# Patient Record
Sex: Male | Born: 1995 | Race: Black or African American | Hispanic: No | Marital: Single | Smoking: Never smoker
Health system: Southern US, Community
[De-identification: ages and names within clinical notes are randomized; demographics above are authoritative.]

## PROBLEM LIST (undated history)

## (undated) DIAGNOSIS — R519 Headache, unspecified: Secondary | ICD-10-CM

## (undated) DIAGNOSIS — K389 Disease of appendix, unspecified: Secondary | ICD-10-CM

## (undated) HISTORY — PX: MULTIPLE TOOTH EXTRACTIONS: SHX2053

---

## 2017-10-02 ENCOUNTER — Emergency Department (HOSPITAL_COMMUNITY): Payer: 59

## 2017-10-02 ENCOUNTER — Emergency Department (HOSPITAL_COMMUNITY)
Admission: EM | Admit: 2017-10-02 | Discharge: 2017-10-02 | Disposition: A | Discharge status: Home / Self Care | Payer: 59 | Attending: Emergency Medicine | Admitting: Emergency Medicine

## 2017-10-02 ENCOUNTER — Encounter (HOSPITAL_COMMUNITY): Payer: Self-pay | Admitting: Emergency Medicine

## 2017-10-02 DIAGNOSIS — R110 Nausea: Secondary | ICD-10-CM | POA: Diagnosis not present

## 2017-10-02 DIAGNOSIS — R1031 Right lower quadrant pain: Secondary | ICD-10-CM

## 2017-10-02 LAB — COMPREHENSIVE METABOLIC PANEL
ALBUMIN: 4.3 g/dL (ref 3.5–5.0)
ALK PHOS: 92 U/L (ref 38–126)
ALT: 15 U/L — ABNORMAL LOW (ref 17–63)
ANION GAP: 9 (ref 5–15)
AST: 18 U/L (ref 15–41)
BILIRUBIN TOTAL: 0.7 mg/dL (ref 0.3–1.2)
BUN: 13 mg/dL (ref 6–20)
CALCIUM: 9.4 mg/dL (ref 8.9–10.3)
CO2: 27 mmol/L (ref 22–32)
Chloride: 105 mmol/L (ref 101–111)
Creatinine, Ser: 0.98 mg/dL (ref 0.61–1.24)
GLUCOSE: 88 mg/dL (ref 65–99)
POTASSIUM: 4 mmol/L (ref 3.5–5.1)
Sodium: 141 mmol/L (ref 135–145)
TOTAL PROTEIN: 7.3 g/dL (ref 6.5–8.1)

## 2017-10-02 LAB — URINALYSIS, ROUTINE W REFLEX MICROSCOPIC
BILIRUBIN URINE: NEGATIVE
Glucose, UA: NEGATIVE mg/dL
Hgb urine dipstick: NEGATIVE
KETONES UR: NEGATIVE mg/dL
LEUKOCYTES UA: NEGATIVE
NITRITE: NEGATIVE
Protein, ur: NEGATIVE mg/dL
SPECIFIC GRAVITY, URINE: 1.015 (ref 1.005–1.030)
pH: 6 (ref 5.0–8.0)

## 2017-10-02 LAB — CBC
HEMATOCRIT: 43.1 % (ref 39.0–52.0)
Hemoglobin: 14.6 g/dL (ref 13.0–17.0)
MCH: 28.2 pg (ref 26.0–34.0)
MCHC: 33.9 g/dL (ref 30.0–36.0)
MCV: 83.4 fL (ref 78.0–100.0)
Platelets: 221 10*3/uL (ref 150–400)
RBC: 5.17 MIL/uL (ref 4.22–5.81)
RDW: 12.3 % (ref 11.5–15.5)
WBC: 10 10*3/uL (ref 4.0–10.5)

## 2017-10-02 LAB — LIPASE, BLOOD: Lipase: 27 U/L (ref 11–51)

## 2017-10-02 MED ORDER — IOPAMIDOL (ISOVUE-300) INJECTION 61%
INTRAVENOUS | Status: AC
  Administered 2017-10-02: 100 mL via INTRAVENOUS
  Filled 2017-10-02: qty 100

## 2017-10-02 MED ORDER — SODIUM CHLORIDE 0.9 % IV BOLUS (SEPSIS)
1000.0000 mL | Freq: Once | INTRAVENOUS | Status: AC
  Administered 2017-10-02: 1000 mL via INTRAVENOUS

## 2017-10-02 MED ORDER — ONDANSETRON 4 MG PO TBDP: 4.0000 mg | ORAL_TABLET | Freq: Three times a day (TID) | ORAL | 0 refills | Status: DC | PRN

## 2017-10-02 MED ORDER — DICYCLOMINE HCL 20 MG PO TABS: 20.0000 mg | ORAL_TABLET | Freq: Two times a day (BID) | ORAL | 0 refills | Status: DC

## 2017-10-02 NOTE — ED Notes (Signed)
Patient sent back to lobby after blood draw by writer.  

## 2017-10-02 NOTE — ED Provider Notes (Signed)
Emergency Department Provider Note   I have reviewed the triage vital signs and the nursing notes.   HISTORY  Chief Complaint Abdominal Pain   HPI Daniel Pruitt is a 21 y.o. male with no significant PMH presents to the emergency room in for evaluation of worsening abdominal pain. He had generalized abdominal discomfort yesterday and today is experiencing more right lower quadrant abdominal pain. No similar symptoms in the past. No fevers or chills. No nausea, vomiting, diarrhea. No surgical history on the abdomen. He went to urgent care and was referred to the emergency pertinent for further evaluation and concern for possible appendicitis. His last PO intake was a snack in the waiting room at approximately 5 PM.     History reviewed. No pertinent past medical history.  There are no active problems to display for this patient.   History reviewed. No pertinent surgical history.    Allergies Patient has no known allergies.  No family history on file.  Social History Social History  Substance Use Topics  . Smoking status: Never Smoker  . Smokeless tobacco: Never Used  . Alcohol use No    Review of Systems  Constitutional: No fever/chills Eyes: No visual changes. ENT: No sore throat. Cardiovascular: Denies chest pain. Respiratory: Denies shortness of breath. Gastrointestinal: Positive RLQ abdominal pain. Positive nausea, no vomiting.  No diarrhea.  No constipation. Genitourinary: Negative for dysuria. Musculoskeletal: Negative for back pain. Skin: Negative for rash. Neurological: Negative for headaches, focal weakness or numbness.  10-point ROS otherwise negative.  ____________________________________________   PHYSICAL EXAM:  VITAL SIGNS: ED Triage Vitals  Enc Vitals Group     BP 10/02/17 1547 (!) 142/98     Pulse Rate 10/02/17 1547 74     Resp 10/02/17 1547 15     Temp 10/02/17 1547 98.1 F (36.7 C)     Temp Source 10/02/17 1547 Oral     SpO2  10/02/17 1547 99 %     Weight 10/02/17 1548 156 lb (70.8 kg)    Constitutional: Alert and oriented. Well appearing and in no acute distress. Eyes: Conjunctivae are normal.  Head: Atraumatic. Nose: No congestion/rhinnorhea. Mouth/Throat: Mucous membranes are moist.  Oropharynx non-erythematous. Neck: No stridor. Cardiovascular: Normal rate, regular rhythm. Good peripheral circulation. Grossly normal heart sounds.   Respiratory: Normal respiratory effort.  No retractions. Lungs CTAB. Gastrointestinal: Soft with mild RLQ tenderness with no rebound. No voluntary guarding with deep palpation. No distention. Musculoskeletal: No lower extremity tenderness nor edema. No gross deformities of extremities. Neurologic:  Normal speech and language. No gross focal neurologic deficits are appreciated.  Skin:  Skin is warm, dry and intact. No rash noted.  ____________________________________________   LABS (all labs ordered are listed, but only abnormal results are displayed)  Labs Reviewed  COMPREHENSIVE METABOLIC PANEL - Abnormal; Notable for the following:       Result Value   ALT 15 (*)    All other components within normal limits  LIPASE, BLOOD  CBC  URINALYSIS, ROUTINE W REFLEX MICROSCOPIC   ____________________________________________  RADIOLOGY  Ct Abdomen Pelvis W Contrast  Result Date: 10/02/2017 CLINICAL DATA:  Right lower quadrant pain since yesterday. Rule out appendicitis. EXAM: CT ABDOMEN AND PELVIS WITH CONTRAST TECHNIQUE: Multidetector CT imaging of the abdomen and pelvis was performed using the standard protocol following bolus administration of intravenous contrast. CONTRAST:  100 mL of Isovue-300 COMPARISON:  None. FINDINGS: Lower chest: No acute abnormality. Hepatobiliary: The gallbladder is decompressed limiting evaluation. The portal vein is  normal. Focal fatty deposition is seen at the falciform ligament. The liver is otherwise normal. Pancreas: Unremarkable. No  pancreatic ductal dilatation or surrounding inflammatory changes. Spleen: Normal in size without focal abnormality. Adrenals/Urinary Tract: The adrenal glands are normal as are the kidneys and ureters. The bladder is distended which is nonspecific. Stomach/Bowel: The stomach and small bowel are normal. The colon is normal. There is an appendicolith in the appendix measuring 9 mm in length and 2.5 mm in width. There is no wall thickening or dilatation associated with the appendix. No periappendiceal stranding is noted. Vascular/Lymphatic: No significant vascular findings are present. No enlarged abdominal or pelvic lymph nodes. Reproductive: Prostate is unremarkable. Other: No abdominal wall hernia or abnormality. No abdominopelvic ascites. Musculoskeletal: No acute or significant osseous findings. IMPRESSION: 1. There is an appendicolith but no evidence of acute appendicitis on today's study. 2. No other acute abnormalities to explain the patient's symptoms. Electronically Signed   By: Gerome Sam III M.D   On: 10/02/2017 19:04    ____________________________________________   PROCEDURES  Procedure(s) performed:   Procedures  None ____________________________________________   INITIAL IMPRESSION / ASSESSMENT AND PLAN / ED COURSE  Pertinent labs & imaging results that were available during my care of the patient were reviewed by me and considered in my medical decision making (see chart for details).  Patient resents to the emergency department for evaluation of right lower quadrant abdominal pain. He has mild tenderness to palpation of the right lower quadrant with some voluntary guarding to deep palpation. No rebound tenderness. No leukocytosis on triage labs. Last PO was 5 PM today. Given exam plan for CT to evaluation for acute appendicitis.   07:10 PM No evidence of acute appendicitis on CT. Patient is well appearing, sitting on the edge of the bed. Plan for symptom management and PCP  follow-up. Discussed strict return precautions.   At this time, I do not feel there is any life-threatening condition present. I have reviewed and discussed all results (EKG, imaging, lab, urine as appropriate), exam findings with patient. I have reviewed nursing notes and appropriate previous records.  I feel the patient is safe to be discharged home without further emergent workup. Discussed usual and customary return precautions. Patient and family (if present) verbalize understanding and are comfortable with this plan.  Patient will follow-up with their primary care provider. If they do not have a primary care provider, information for follow-up has been provided to them. All questions have been answered.  ____________________________________________  FINAL CLINICAL IMPRESSION(S) / ED DIAGNOSES  Final diagnoses:  Right lower quadrant abdominal pain  Nausea     MEDICATIONS GIVEN DURING THIS VISIT:  Medications  sodium chloride 0.9 % bolus 1,000 mL (1,000 mLs Intravenous New Bag/Given 10/02/17 1748)  iopamidol (ISOVUE-300) 61 % injection (100 mLs Intravenous Contrast Given 10/02/17 1817)     NEW OUTPATIENT MEDICATIONS STARTED DURING THIS VISIT:  New Prescriptions   DICYCLOMINE (BENTYL) 20 MG TABLET    Take 1 tablet (20 mg total) by mouth 2 (two) times daily.   ONDANSETRON (ZOFRAN ODT) 4 MG DISINTEGRATING TABLET    Take 1 tablet (4 mg total) by mouth every 8 (eight) hours as needed for nausea or vomiting.    Note:  This document was prepared using Dragon voice recognition software and may include unintentional dictation errors.  Alona Bene, MD Emergency Medicine    Shacoya Burkhammer, Arlyss Repress, MD 10/02/17 [phone]

## 2017-10-02 NOTE — ED Triage Notes (Signed)
Patient reports having LRQ abdominal pain since yesterday. Went to UC earlier today and was sent here to r/o appendicitis.

## 2017-10-02 NOTE — Discharge Instructions (Signed)

## 2018-10-18 ENCOUNTER — Ambulatory Visit (HOSPITAL_COMMUNITY)
Admission: EM | Admit: 2018-10-18 | Discharge: 2018-10-18 | Disposition: A | Discharge status: Home / Self Care | Payer: 59 | Source: Home / Self Care

## 2018-10-18 ENCOUNTER — Encounter (HOSPITAL_COMMUNITY): Payer: Self-pay

## 2018-10-18 ENCOUNTER — Emergency Department (HOSPITAL_COMMUNITY)
Admission: EM | Admit: 2018-10-18 | Discharge: 2018-10-19 | Disposition: A | Discharge status: Home / Self Care | Payer: 59 | Attending: Emergency Medicine | Admitting: Emergency Medicine

## 2018-10-18 ENCOUNTER — Other Ambulatory Visit: Payer: Self-pay

## 2018-10-18 DIAGNOSIS — R1031 Right lower quadrant pain: Secondary | ICD-10-CM

## 2018-10-18 DIAGNOSIS — K381 Appendicular concretions: Secondary | ICD-10-CM | POA: Insufficient documentation

## 2018-10-18 DIAGNOSIS — K389 Disease of appendix, unspecified: Secondary | ICD-10-CM | POA: Insufficient documentation

## 2018-10-18 HISTORY — DX: Disease of appendix, unspecified: K38.9

## 2018-10-18 LAB — CBC
HCT: 49.5 % (ref 39.0–52.0)
HEMOGLOBIN: 16.2 g/dL (ref 13.0–17.0)
MCH: 27.8 pg (ref 26.0–34.0)
MCHC: 32.7 g/dL (ref 30.0–36.0)
MCV: 85.1 fL (ref 80.0–100.0)
Platelets: 270 10*3/uL (ref 150–400)
RBC: 5.82 MIL/uL — AB (ref 4.22–5.81)
RDW: 11.9 % (ref 11.5–15.5)
WBC: 10.1 10*3/uL (ref 4.0–10.5)
nRBC: 0 % (ref 0.0–0.2)

## 2018-10-18 LAB — COMPREHENSIVE METABOLIC PANEL
ALBUMIN: 4.7 g/dL (ref 3.5–5.0)
ALK PHOS: 60 U/L (ref 38–126)
ALT: 17 U/L (ref 0–44)
ANION GAP: 10 (ref 5–15)
AST: 18 U/L (ref 15–41)
BUN: 14 mg/dL (ref 6–20)
CO2: 29 mmol/L (ref 22–32)
Calcium: 9.6 mg/dL (ref 8.9–10.3)
Chloride: 103 mmol/L (ref 98–111)
Creatinine, Ser: 1.01 mg/dL (ref 0.61–1.24)
GFR calc Af Amer: >60 mL/min (ref >60)
GFR calc non Af Amer: >60 mL/min (ref >60)
GLUCOSE: 91 mg/dL (ref 70–99)
POTASSIUM: 4 mmol/L (ref 3.5–5.1)
Sodium: 142 mmol/L (ref 135–145)
Total Bilirubin: 1.4 mg/dL — ABNORMAL HIGH (ref 0.3–1.2)
Total Protein: 7.1 g/dL (ref 6.5–8.1)

## 2018-10-18 LAB — LIPASE, BLOOD: Lipase: 24 U/L (ref 11–51)

## 2018-10-18 NOTE — ED Triage Notes (Signed)
He c/o rlq abd. Pain. He states he had a similar episode a year ago at which time he was dx with an appendicolith; and which improved with antibiotic therapy. He is in no distress.

## 2018-10-19 ENCOUNTER — Encounter (HOSPITAL_COMMUNITY): Payer: Self-pay

## 2018-10-19 ENCOUNTER — Emergency Department (HOSPITAL_COMMUNITY): Payer: 59

## 2018-10-19 LAB — URINALYSIS, ROUTINE W REFLEX MICROSCOPIC
BILIRUBIN URINE: NEGATIVE
Glucose, UA: NEGATIVE mg/dL
HGB URINE DIPSTICK: NEGATIVE
KETONES UR: 5 mg/dL — AB
Leukocytes, UA: NEGATIVE
NITRITE: NEGATIVE
Protein, ur: NEGATIVE mg/dL
Specific Gravity, Urine: 1.013 (ref 1.005–1.030)
pH: 6 (ref 5.0–8.0)

## 2018-10-19 MED ORDER — IOPAMIDOL (ISOVUE-300) INJECTION 61%
100.0000 mL | Freq: Once | INTRAVENOUS | Status: AC | PRN
  Administered 2018-10-19: 100 mL via INTRAVENOUS

## 2018-10-19 MED ORDER — ONDANSETRON 4 MG PO TBDP: 4.0000 mg | ORAL_TABLET | Freq: Three times a day (TID) | ORAL | 0 refills | Status: DC | PRN

## 2018-10-19 MED ORDER — IOPAMIDOL (ISOVUE-300) INJECTION 61%
INTRAVENOUS | Status: AC
  Filled 2018-10-19: qty 100

## 2018-10-19 MED ORDER — SODIUM CHLORIDE 0.9 % IJ SOLN
INTRAMUSCULAR | Status: AC
  Administered 2018-10-19: 01:00:00
  Filled 2018-10-19: qty 50

## 2018-10-19 MED ORDER — HYDROCODONE-ACETAMINOPHEN 5-325 MG PO TABS: 1.0000 | ORAL_TABLET | ORAL | 0 refills | Status: DC | PRN

## 2018-10-19 NOTE — ED Provider Notes (Signed)
[facility]-EMERGENCY DEPT Provider Note   CSN: [id number] Arrival date & time: 10/18/18  2003     History   Chief Complaint Chief Complaint  Patient presents with  . Abdominal Pain    HPI Daniel Pruitt is a 22 y.o. male.  The history is provided by the patient and medical records.  Abdominal Pain   Associated symptoms include nausea.    22 year old male presenting to the ED with right lower quadrant abdominal pain.  States mild 2 days ago but has been worsening since that time.  He reports some nausea but denies vomiting.  Has been eating and drinking well.  No reported fever or chills.  No diarrhea or urinary symptoms.  States he had similar symptoms last year, seen in the ED and diagnosed with appendicolith.  States he was started on antibiotics and seemed to do well with this.  No prior abdominal surgeries.  Past Medical History:  Diagnosis Date  . Appendicolith     Patient Active Problem List   Diagnosis Date Noted  . Appendicolith     No past surgical history on file.      Home Medications    Prior to Admission medications   Medication Sig Start Date End Date Taking? Authorizing Provider  dicyclomine (BENTYL) 20 MG tablet Take 1 tablet (20 mg total) by mouth 2 (two) times daily. Patient not taking: Reported on 10/19/2018 10/02/17   Long, Arlyss Repress, MD  ondansetron (ZOFRAN ODT) 4 MG disintegrating tablet Take 1 tablet (4 mg total) by mouth every 8 (eight) hours as needed for nausea or vomiting. Patient not taking: Reported on 10/19/2018 10/02/17   Long, Arlyss Repress, MD    Family History No family history on file.  Social History Social History   Tobacco Use  . Smoking status: Never Smoker  . Smokeless tobacco: Never Used  Substance Use Topics  . Alcohol use: No  . Drug use: Not on file     Allergies   Patient has no known allergies.   Review of Systems Review of Systems  Gastrointestinal: Positive for abdominal pain and  nausea.  All other systems reviewed and are negative.    Physical Exam Updated Vital Signs BP (!) 137/99 (BP Location: Right Arm)   Pulse (!) 51   Temp 98.2 F (36.8 C) (Oral)   Resp 16   Ht 5\' 8"  (1.727 m)   Wt 73 kg   SpO2 99%   BMI 24.48 kg/m   Physical Exam  Constitutional: He is oriented to person, place, and time. He appears well-developed and well-nourished.  HENT:  Head: Normocephalic and atraumatic.  Mouth/Throat: Oropharynx is clear and moist.  Eyes: Pupils are equal, round, and reactive to light. Conjunctivae and EOM are normal.  Neck: Normal range of motion.  Cardiovascular: Normal rate, regular rhythm and normal heart sounds.  Pulmonary/Chest: Effort normal and breath sounds normal.  Abdominal: Soft. Bowel sounds are normal. There is tenderness in the right lower quadrant. There is no rigidity and no guarding.  Musculoskeletal: Normal range of motion.  Neurological: He is alert and oriented to person, place, and time.  Skin: Skin is warm and dry.  Psychiatric: He has a normal mood and affect.  Nursing note and vitals reviewed.    ED Treatments / Results  Labs (all labs ordered are listed, but only abnormal results are displayed) Labs Reviewed  COMPREHENSIVE METABOLIC PANEL - Abnormal; Notable for the following components:      Result Value  Total Bilirubin 1.4 (*)    All other components within normal limits  CBC - Abnormal; Notable for the following components:   RBC 5.82 (*)    All other components within normal limits  URINALYSIS, ROUTINE W REFLEX MICROSCOPIC - Abnormal; Notable for the following components:   Ketones, ur 5 (*)    All other components within normal limits  LIPASE, BLOOD    EKG None  Radiology Ct Abdomen Pelvis W Contrast  Result Date: 10/19/2018 CLINICAL DATA:  Right lower quadrant pain. White cell count 10.1. History of appendicoliths EXAM: CT ABDOMEN AND PELVIS WITH CONTRAST TECHNIQUE: Multidetector CT imaging of the  abdomen and pelvis was performed using the standard protocol following bolus administration of intravenous contrast. CONTRAST:  100 mL Isovue-300 COMPARISON:  10/02/2017 FINDINGS: Lower chest: Lung bases are clear. Hepatobiliary: No focal liver abnormality is seen. No gallstones, gallbladder wall thickening, or biliary dilatation. Pancreas: Unremarkable. No pancreatic ductal dilatation or surrounding inflammatory changes. Spleen: Normal in size without focal abnormality. Adrenals/Urinary Tract: Adrenal glands are unremarkable. Kidneys are normal, without renal calculi, focal lesion, or hydronephrosis. Bladder is unremarkable. Stomach/Bowel: Stomach, small bowel, and colon are not abnormally distended. No wall thickening or inflammatory changes. Appendix again contains an appendicoliths at the base but is otherwise normal in appearance. No distention or inflammatory changes appreciated. Prominent right lower quadrant lymph nodes are likely reactive. Vascular/Lymphatic: No significant vascular findings are present. No enlarged abdominal or pelvic lymph nodes. Reproductive: Prostate is unremarkable. Other: No abdominal wall hernia or abnormality. No abdominopelvic ascites. Musculoskeletal: No acute or significant osseous findings. IMPRESSION: Appendicular within the base of the appendix with otherwise normal appearing appendix. No inflammatory changes to suggest appendicitis. Prominent right lower quadrant lymph nodes are nonspecific but likely to be reactive. Electronically Signed   By: Burman Nieves M.D.   On: 10/19/2018 01:09    Procedures Procedures (including critical care time)  Medications Ordered in ED Medications - No data to display   Initial Impression / Assessment and Plan / ED Course  I have reviewed the triage vital signs and the nursing notes.  Pertinent labs & imaging results that were available during my care of the patient were reviewed by me and considered in my medical decision  making (see chart for details).  22 y.o. M here with RLQ abdominal pain.  Reports he was seen last year for same, diagnosed with appendicolith.  Started having pain again 2 days ago, steadily worsening.  He is afebrile and nontoxic in appearance.  Does have some tenderness to the right lower quadrant but overall is mild.  Remainder of exam is benign.  Labs reassuring.  Given his history, CT was obtained without findings of acute appendicitis.  Given patient having recurrent issues, will refer to general surgery clinic for ongoing management.  Rx vicodin, zofran for symptomatic control.  He will return here for any new/acute changes.  Final Clinical Impressions(s) / ED Diagnoses   Final diagnoses:  Appendicolith    ED Discharge Orders         Ordered    HYDROcodone-acetaminophen (NORCO/VICODIN) 5-325 MG tablet  Every 4 hours PRN     10/19/18 0156    ondansetron (ZOFRAN ODT) 4 MG disintegrating tablet  Every 8 hours PRN     10/19/18 0156           Garlon Hatchet, PA-C 10/19/18 0216    Dione Booze, MD 10/19/18 [phone]

## 2018-10-19 NOTE — Discharge Instructions (Signed)
Take the prescribed medication as directed. Follow-up with the surgery clinic if ongoing pain or other issues-- call for appt. Return to the ED for new or worsening symptoms.

## 2019-10-19 ENCOUNTER — Other Ambulatory Visit: Payer: Self-pay

## 2019-10-19 DIAGNOSIS — Z20822 Contact with and (suspected) exposure to covid-19: Secondary | ICD-10-CM

## 2019-10-20 LAB — NOVEL CORONAVIRUS, NAA: SARS-CoV-2, NAA: NOT DETECTED

## 2021-04-30 ENCOUNTER — Ambulatory Visit (HOSPITAL_BASED_OUTPATIENT_CLINIC_OR_DEPARTMENT_OTHER)
Admission: RE | Admit: 2021-04-30 | Discharge: 2021-04-30 | Disposition: A | Discharge status: Home / Self Care | Payer: 59 | Source: Ambulatory Visit | Attending: Family Medicine | Admitting: Family Medicine

## 2021-04-30 ENCOUNTER — Other Ambulatory Visit (HOSPITAL_BASED_OUTPATIENT_CLINIC_OR_DEPARTMENT_OTHER): Payer: Self-pay | Admitting: Family Medicine

## 2021-04-30 ENCOUNTER — Other Ambulatory Visit: Payer: Self-pay

## 2021-04-30 DIAGNOSIS — R1031 Right lower quadrant pain: Secondary | ICD-10-CM | POA: Insufficient documentation

## 2021-06-11 ENCOUNTER — Ambulatory Visit: Payer: Self-pay | Admitting: Surgery

## 2021-06-11 NOTE — H&P (Signed)
History of Present Illness (Daniel Mumaw L. Freida Busman MD; 06/11/2021 4:49 PM) Patient words: HPI: [name] is a 25 yo male who was referred for evaluation of appendicoliths. He has had intermittent RLQ pain over the last 4 years and has been seen in the ED a few times. He has had multiple CT scans dating as far back as October 2018 showing appendicoliths but no evidence of acute appendicitis. He says his initial episode of pain in 2018 was the most severe, but he has had intermittent pain ever since then. It now occurs about once per week, and is associated with nausea but no vomiting. He has occasional diarrhea. He recently had another CT scan on 04/30/21 after having another episode of RLQ pain, and this again showed an appendicolith without signs of acute appendicitis. He was referred to me to discuss appendectomy.  PMH: none  PSH: wisdom tooth extraction  FHx: Denies known family history of colorectal cancer or inflammatory bowel disease.  The patient is a 25 year old male.   Past Surgical History Daniel Pruitt, [place]; 06/11/2021 2:12 PM) Oral Surgery    Diagnostic Studies History Daniel Pruitt, [place]; 06/11/2021 2:12 PM) Colonoscopy   never  Allergies Daniel Pruitt, CMA; 06/11/2021 2:13 PM) No Known Drug Allergies   [06/11/2021]: Allergies Reconciled    Medication History Daniel Pruitt, [place]; 06/11/2021 2:13 PM) No Current Medications  Medications Reconciled   Social History Daniel Pruitt, [place]; 06/11/2021 2:12 PM) Alcohol use   Occasional alcohol use. Caffeine use   Tea. No drug use   Tobacco use   Never smoker.  Family History Daniel Pruitt, [place]; 06/11/2021 2:12 PM) Diabetes Mellitus   Father. Kidney Disease   Sister.  Other Problems Daniel Pruitt, CMA; 06/11/2021 2:12 PM) Other disease, cancer, significant illness      Review of Systems Daniel Pruitt CMA; 06/11/2021 2:12 PM) General Present- Chills and Night Sweats. Not Present- Appetite Loss, Fatigue, Fever,  Weight Gain and Weight Loss. Skin Not Present- Change in Wart/Mole, Dryness, Hives, Jaundice, New Lesions, Non-Healing Wounds, Rash and Ulcer. HEENT Present- Wears glasses/contact lenses. Not Present- Earache, Hearing Loss, Hoarseness, Nose Bleed, Oral Ulcers, Ringing in the Ears, Seasonal Allergies, Sinus Pain, Sore Throat, Visual Disturbances and Yellow Eyes. Respiratory Not Present- Bloody sputum, Chronic Cough, Difficulty Breathing, Snoring and Wheezing. Breast Not Present- Breast Mass, Breast Pain, Nipple Discharge and Skin Changes. Cardiovascular Not Present- Chest Pain, Difficulty Breathing Lying Down, Leg Cramps, Palpitations, Rapid Heart Rate, Shortness of Breath and Swelling of Extremities. Gastrointestinal Not Present- Abdominal Pain, Bloating, Bloody Stool, Change in Bowel Habits, Chronic diarrhea, Constipation, Difficulty Swallowing, Excessive gas, Gets full quickly at meals, Hemorrhoids, Indigestion, Nausea, Rectal Pain and Vomiting. Male Genitourinary Not Present- Blood in Urine, Change in Urinary Stream, Frequency, Impotence, Nocturia, Painful Urination, Urgency and Urine Leakage. Musculoskeletal Not Present- Back Pain, Joint Pain, Joint Stiffness, Muscle Pain, Muscle Weakness and Swelling of Extremities. Neurological Not Present- Decreased Memory, Fainting, Headaches, Numbness, Seizures, Tingling, Tremor, Trouble walking and Weakness. Psychiatric Not Present- Anxiety, Bipolar, Change in Sleep Pattern, Depression, Fearful and Frequent crying. Endocrine Not Present- Cold Intolerance, Excessive Hunger, Hair Changes, Heat Intolerance, Hot flashes and New Diabetes. Hematology Not Present- Blood Thinners, Easy Bruising, Excessive bleeding, Gland problems, HIV and Persistent Infections.  Vitals Daniel Pruitt CMA; 06/11/2021 2:13 PM) 06/11/2021 2:12 PM Weight: 170.6 lb   Height: 69 in  Body Surface Area: 1.93 m   Body Mass Index: 25.19 kg/m   Temp.: 98.3 F    Pulse: 110 (Regular)  BP: 122/80(Sitting, Left Arm, Standard)       Physical Exam (Kinsleigh Ludolph L. Freida Busman MD; 06/11/2021 4:49 PM) The physical exam findings are as follows: Note:  Constitutional: No acute distress; conversant; no deformities Neuro: alert and oriented; cranial nerves grossly in tact; no focal deficits Eyes: Moist conjunctiva; anicteric sclerae; extraocular movements in tact Neck: Trachea midline Lungs: Normal respiratory effort; lungs clear to auscultation bilaterally; symmetric chest wall expansion CV: Regular rate and rhythm; no murmurs; no pitting edema GI: Abdomen soft, nontender, nondistended; no masses or organomegaly; no surgical scars MSK: Normal gait and station; no clubbing/cyanosis Psychiatric: Appropriate affect; alert and oriented 3    Assessment & Plan (Tondra Reierson L. Freida Busman MD; 06/11/2021 4:53 PM) APPENDICOLITH (K38.9) Story: 25 yo male referred with frequent RLQ abdominal pain and appendicolith. I reviewed his CT scans. He has an appendicolith near the mid-portion of the appendix, but no periappendiceal stranding or signs of acute appendicitis. He also has some mildly prominent RLQ lymph nodes. Since he has RLQ pain in the setting of an appendicolith, I think it is reasonable to perform an appendectomy. He is at risk of developing acute appendicitis. It is also possible the appendicolith is incidental and the pain is due to another etiology such as mesenteric adenitis, although I think this is less likely. I discussed this with the patient. I recommended laparoscopic appendectomy. The details of the procedure were discussed and he agrees to proceed. This is to be scheduled electively and he will be contacted to schedule a surgery date.  Sophronia Simas, MD [facility] Surgery General, Hepatobiliary and Pancreatic Surgery 06/11/21 4:54 PM

## 2021-06-11 NOTE — H&P (View-Only) (Signed)
History of Present Illness (Dheeraj Hail L. Freida Busman MD; 06/11/2021 4:49 PM) Patient words: HPI: Daniel Pruitt is a 25 yo male who was referred for evaluation of appendicoliths. He has had intermittent RLQ pain over the last 4 years and has been seen in the ED a few times. He has had multiple CT scans dating as far back as October 2018 showing appendicoliths but no evidence of acute appendicitis. He says his initial episode of pain in 2018 was the most severe, but he has had intermittent pain ever since then. It now occurs about once per week, and is associated with nausea but no vomiting. He has occasional diarrhea. He recently had another CT scan on 04/30/21 after having another episode of RLQ pain, and this again showed an appendicolith without signs of acute appendicitis. He was referred to me to discuss appendectomy.  PMH: none  PSH: wisdom tooth extraction  FHx: Denies known family history of colorectal cancer or inflammatory bowel disease.  The patient is a 25 year old male.   Past Surgical History Daniel Pruitt, Daniel Pruitt; 06/11/2021 2:12 PM) Oral Surgery    Diagnostic Studies History Daniel Pruitt, Daniel Pruitt; 06/11/2021 2:12 PM) Colonoscopy   never  Allergies Daniel Pruitt, CMA; 06/11/2021 2:13 PM) No Known Drug Allergies   [06/11/2021]: Allergies Reconciled    Medication History Daniel Pruitt, Daniel Pruitt; 06/11/2021 2:13 PM) No Current Medications  Medications Reconciled   Social History Daniel Pruitt, Daniel Pruitt; 06/11/2021 2:12 PM) Alcohol use   Occasional alcohol use. Caffeine use   Tea. No drug use   Tobacco use   Never smoker.  Family History Daniel Pruitt, Daniel Pruitt; 06/11/2021 2:12 PM) Diabetes Mellitus   Father. Kidney Disease   Sister.  Other Problems Daniel Pruitt, CMA; 06/11/2021 2:12 PM) Other disease, cancer, significant illness      Review of Systems Daniel Pruitt CMA; 06/11/2021 2:12 PM) General Present- Chills and Night Sweats. Not Present- Appetite Loss, Fatigue, Fever,  Weight Gain and Weight Loss. Skin Not Present- Change in Wart/Mole, Dryness, Hives, Jaundice, Daniel Lesions, Non-Healing Wounds, Rash and Ulcer. HEENT Present- Wears glasses/contact lenses. Not Present- Earache, Hearing Loss, Hoarseness, Nose Bleed, Oral Ulcers, Ringing in the Ears, Seasonal Allergies, Sinus Pain, Sore Throat, Visual Disturbances and Yellow Eyes. Respiratory Not Present- Bloody sputum, Chronic Cough, Difficulty Breathing, Snoring and Wheezing. Breast Not Present- Breast Mass, Breast Pain, Nipple Discharge and Skin Changes. Cardiovascular Not Present- Chest Pain, Difficulty Breathing Lying Down, Leg Cramps, Palpitations, Rapid Heart Rate, Shortness of Breath and Swelling of Extremities. Gastrointestinal Not Present- Abdominal Pain, Bloating, Bloody Stool, Change in Bowel Habits, Chronic diarrhea, Constipation, Difficulty Swallowing, Excessive gas, Gets full quickly at meals, Hemorrhoids, Indigestion, Nausea, Rectal Pain and Vomiting. Male Genitourinary Not Present- Blood in Urine, Change in Urinary Stream, Frequency, Impotence, Nocturia, Painful Urination, Urgency and Urine Leakage. Musculoskeletal Not Present- Back Pain, Joint Pain, Joint Stiffness, Muscle Pain, Muscle Weakness and Swelling of Extremities. Neurological Not Present- Decreased Memory, Fainting, Headaches, Numbness, Seizures, Tingling, Tremor, Trouble walking and Weakness. Psychiatric Not Present- Anxiety, Bipolar, Change in Sleep Pattern, Depression, Fearful and Frequent crying. Endocrine Not Present- Cold Intolerance, Excessive Hunger, Hair Changes, Heat Intolerance, Hot flashes and Daniel Diabetes. Hematology Not Present- Blood Thinners, Easy Bruising, Excessive bleeding, Gland problems, HIV and Persistent Infections.  Vitals Daniel Pruitt CMA; 06/11/2021 2:13 PM) 06/11/2021 2:12 PM Weight: 170.6 lb   Height: 69 in  Body Surface Area: 1.93 m   Body Mass Index: 25.19 kg/m   Temp.: 98.3 F    Pulse: 110 (Regular)  BP: 122/80(Sitting, Left Arm, Standard)       Physical Exam (Daniel Pruitt L. Daniel Sonneborn MD; 06/11/2021 4:49 PM) The physical exam findings are as follows: Note:  Constitutional: No acute distress; conversant; no deformities Neuro: alert and oriented; cranial nerves grossly in tact; no focal deficits Eyes: Moist conjunctiva; anicteric sclerae; extraocular movements in tact Neck: Trachea midline Lungs: Normal respiratory effort; lungs clear to auscultation bilaterally; symmetric chest wall expansion CV: Regular rate and rhythm; no murmurs; no pitting edema GI: Abdomen soft, nontender, nondistended; no masses or organomegaly; no surgical scars MSK: Normal gait and station; no clubbing/cyanosis Psychiatric: Appropriate affect; alert and oriented 3    Assessment & Plan (Daniel Pruitt L. Daniel Springborn MD; 06/11/2021 4:53 PM) APPENDICOLITH (K38.9) Story: 25 yo male referred with frequent RLQ abdominal pain and appendicolith. I reviewed his CT scans. He has an appendicolith near the mid-portion of the appendix, but no periappendiceal stranding or signs of acute appendicitis. He also has some mildly prominent RLQ lymph nodes. Since he has RLQ pain in the setting of an appendicolith, I think it is reasonable to perform an appendectomy. He is at risk of developing acute appendicitis. It is also possible the appendicolith is incidental and the pain is due to another etiology such as mesenteric adenitis, although I think this is less likely. I discussed this with the patient. I recommended laparoscopic appendectomy. The details of the procedure were discussed and he agrees to proceed. This is to be scheduled electively and he will be contacted to schedule a surgery date.  Karalynn Cottone, MD [facility], Hepatobiliary and Pancreatic Surgery 06/11/21 4:54 PM   

## 2021-07-01 NOTE — Pre-Procedure Instructions (Addendum)
Surgical Instructions    Your procedure is scheduled on Wednesday, July 27th.  Report to [facility] Main Entrance "A" at 8:00 A.M., then check in with the Admitting office.  Call this number if you have problems the morning of surgery:  [phone]   If you have any questions prior to your surgery date call [phone]: Open Monday-Friday 8am-4pm    Remember:  Do not eat or drink after midnight the night before your surgery    There are no medications that you need to take on the morning of surgery.  As of today, STOP taking any Aspirin (unless otherwise instructed by your surgeon) Aleve, Naproxen, Ibuprofen, Motrin, Advil, Goody's, BC's, all herbal medications, fish oil, and all vitamins.                     Do NOT Smoke (Tobacco/Vaping) or drink Alcohol 24 hours prior to your procedure.  If you use a CPAP at night, you may bring all equipment for your overnight stay.   Contacts, glasses, piercing's, hearing aid's, dentures or partials may not be worn into surgery, please bring cases for these belongings.    For patients admitted to the hospital, discharge time will be determined by your treatment team.   Patients discharged the day of surgery will not be allowed to drive home, and someone needs to stay with them for 24 hours.  ONLY 1 SUPPORT PERSON MAY BE PRESENT WHILE YOU ARE IN SURGERY. IF YOU ARE TO BE ADMITTED ONCE YOU ARE IN YOUR ROOM YOU WILL BE ALLOWED TWO (2) VISITORS.  Minor children may have two parents present. Special consideration for safety and communication needs will be reviewed on a case by case basis.   Special instructions:   [place]- Preparing For Surgery  Before surgery, you can play an important role. Because skin is not sterile, your skin needs to be as free of germs as possible. You can reduce the number of germs on your skin by washing with CHG (chlorahexidine gluconate) Soap before surgery.  CHG is an antiseptic cleaner which kills germs and bonds  with the skin to continue killing germs even after washing.    Oral Hygiene is also important to reduce your risk of infection.  Remember - BRUSH YOUR TEETH THE MORNING OF SURGERY WITH YOUR REGULAR TOOTHPASTE  Please do not use if you have an allergy to CHG or antibacterial soaps. If your skin becomes reddened/irritated stop using the CHG.  Do not shave (including legs and underarms) for at least 48 hours prior to first CHG shower. It is OK to shave your face.  Please follow these instructions carefully.   Shower the NIGHT BEFORE SURGERY and the MORNING OF SURGERY  If you chose to wash your hair, wash your hair first as usual with your normal shampoo.  After you shampoo, rinse your hair and body thoroughly to remove the shampoo.  Use CHG Soap as you would any other liquid soap. You can apply CHG directly to the skin and wash gently with a scrungie or a clean washcloth.   Apply the CHG Soap to your body ONLY FROM THE NECK DOWN.  Do not use on open wounds or open sores. Avoid contact with your eyes, ears, mouth and genitals (private parts). Wash Face and genitals (private parts)  with your normal soap.   Wash thoroughly, paying special attention to the area where your surgery will be performed.  Thoroughly rinse your body with warm water from  the neck down.  DO NOT shower/wash with your normal soap after using and rinsing off the CHG Soap.  Pat yourself dry with a CLEAN TOWEL.  Wear CLEAN PAJAMAS to bed the night before surgery  Place CLEAN SHEETS on your bed the night before your surgery  DO NOT SLEEP WITH PETS.   Day of Surgery: Shower with CHG soap. Do not wear jewelry. Do not wear lotions, powders, colognes, or deodorant. Men may shave face and neck. Do not bring valuables to the hospital. [facility] is not responsible for any belongings or valuables. Wear Clean/Comfortable clothing the morning of surgery Remember to brush your teeth WITH YOUR REGULAR TOOTHPASTE.    Please read over the following fact sheets that you were given.

## 2021-07-02 ENCOUNTER — Encounter (HOSPITAL_COMMUNITY)
Admission: RE | Admit: 2021-07-02 | Discharge: 2021-07-02 | Disposition: A | Discharge status: Home / Self Care | Payer: 59 | Source: Ambulatory Visit | Attending: Surgery | Admitting: Surgery

## 2021-07-02 ENCOUNTER — Other Ambulatory Visit: Payer: Self-pay

## 2021-07-02 ENCOUNTER — Encounter (HOSPITAL_COMMUNITY): Payer: Self-pay

## 2021-07-02 DIAGNOSIS — Z01812 Encounter for preprocedural laboratory examination: Secondary | ICD-10-CM | POA: Insufficient documentation

## 2021-07-02 HISTORY — DX: Headache, unspecified: R51.9

## 2021-07-02 LAB — CBC
HCT: 46.9 % (ref 39.0–52.0)
Hemoglobin: 15.2 g/dL (ref 13.0–17.0)
MCH: 27.8 pg (ref 26.0–34.0)
MCHC: 32.4 g/dL (ref 30.0–36.0)
MCV: 85.7 fL (ref 80.0–100.0)
Platelets: 255 10*3/uL (ref 150–400)
RBC: 5.47 MIL/uL (ref 4.22–5.81)
RDW: 11.9 % (ref 11.5–15.5)
WBC: 6.1 10*3/uL (ref 4.0–10.5)
nRBC: 0 % (ref 0.0–0.2)

## 2021-07-02 NOTE — Progress Notes (Signed)
PCP - Garth Bigness, MD w/ [facility] Cardiologist - Denies  PPM/ICD - Denies  Chest x-ray - N/A EKG - N/A Stress Test - Denies ECHO - Denies Cardiac Cath - Denies  Sleep Study - Denies  Pt negative for DM.  Blood Thinner Instructions: N/A Aspirin Instructions: N/A  ERAS Protcol - N/A PRE-SURGERY Ensure or G2- N/A  COVID TEST- N/A; Ambulatory sx   Anesthesia review: No  Patient denies shortness of breath, fever, cough and chest pain at PAT appointment   All instructions explained to the patient, with a verbal understanding of the material. Patient agrees to go over the instructions while at home for a better understanding. The opportunity to ask questions was provided.

## 2021-07-08 NOTE — Anesthesia Preprocedure Evaluation (Addendum)
Anesthesia Evaluation  Patient identified by MRN, date of birth, ID band Patient awake    Reviewed: Allergy & Precautions, NPO status , Patient's Chart, lab work & pertinent test results  Airway Mallampati: II  TM Distance: >3 FB Neck ROM: Full    Dental no notable dental hx. (+) Chipped,    Pulmonary neg pulmonary ROS,    Pulmonary exam normal breath sounds clear to auscultation       Cardiovascular negative cardio ROS Normal cardiovascular exam Rhythm:Regular Rate:Normal     Neuro/Psych  Headaches, negative psych ROS   GI/Hepatic negative GI ROS, Neg liver ROS,   Endo/Other  negative endocrine ROS  Renal/GU negative Renal ROS  negative genitourinary   Musculoskeletal negative musculoskeletal ROS (+)   Abdominal   Peds  Hematology negative hematology ROS (+)   Anesthesia Other Findings appendicolith  Reproductive/Obstetrics negative OB ROS                            Anesthesia Physical Anesthesia Plan  ASA: 1  Anesthesia Plan: General   Post-op Pain Management:    Induction: Intravenous  PONV Risk Score and Plan: 3 and Ondansetron, Dexamethasone, Midazolam and Treatment may vary due to age or medical condition  Airway Management Planned: Oral ETT  Additional Equipment: None  Intra-op Plan:   Post-operative Plan: Extubation in OR  Informed Consent: I have reviewed the patients History and Physical, chart, labs and discussed the procedure including the risks, benefits and alternatives for the proposed anesthesia with the patient or authorized representative who has indicated his/her understanding and acceptance.     Dental advisory given  Plan Discussed with: CRNA  Anesthesia Plan Comments:        Anesthesia Quick Evaluation

## 2021-07-09 ENCOUNTER — Ambulatory Visit (HOSPITAL_COMMUNITY)
Admission: RE | Admit: 2021-07-09 | Discharge: 2021-07-09 | Disposition: A | Discharge status: Home / Self Care | Payer: 59 | Source: Ambulatory Visit | Attending: Surgery | Admitting: Surgery

## 2021-07-09 ENCOUNTER — Ambulatory Visit (HOSPITAL_COMMUNITY): Payer: 59 | Admitting: Anesthesiology

## 2021-07-09 ENCOUNTER — Encounter (HOSPITAL_COMMUNITY)
Admission: RE | Disposition: A | Discharge status: Home / Self Care | Payer: Self-pay | Source: Ambulatory Visit | Attending: Surgery

## 2021-07-09 ENCOUNTER — Other Ambulatory Visit: Payer: Self-pay

## 2021-07-09 ENCOUNTER — Encounter (HOSPITAL_COMMUNITY): Payer: Self-pay | Admitting: Surgery

## 2021-07-09 DIAGNOSIS — Z841 Family history of disorders of kidney and ureter: Secondary | ICD-10-CM | POA: Diagnosis not present

## 2021-07-09 DIAGNOSIS — Z833 Family history of diabetes mellitus: Secondary | ICD-10-CM | POA: Diagnosis not present

## 2021-07-09 DIAGNOSIS — K381 Appendicular concretions: Secondary | ICD-10-CM | POA: Diagnosis present

## 2021-07-09 HISTORY — PX: LAPAROSCOPIC APPENDECTOMY: SHX408

## 2021-07-09 SURGERY — APPENDECTOMY, LAPAROSCOPIC
Anesthesia: General

## 2021-07-09 MED ORDER — ONDANSETRON HCL 4 MG/2ML IJ SOLN
INTRAMUSCULAR | Status: DC | PRN
  Administered 2021-07-09: 4 mg via INTRAVENOUS

## 2021-07-09 MED ORDER — KETOROLAC TROMETHAMINE 15 MG/ML IJ SOLN
INTRAMUSCULAR | Status: DC | PRN
  Administered 2021-07-09: 30 mg via INTRAVENOUS

## 2021-07-09 MED ORDER — FENTANYL CITRATE (PF) 250 MCG/5ML IJ SOLN
INTRAMUSCULAR | Status: DC | PRN
  Administered 2021-07-09 (×2): 50 ug via INTRAVENOUS

## 2021-07-09 MED ORDER — HYDROCODONE-ACETAMINOPHEN 5-325 MG PO TABS: 1.0000 | ORAL_TABLET | ORAL | 0 refills | Status: AC | PRN

## 2021-07-09 MED ORDER — CEFAZOLIN SODIUM-DEXTROSE 2-4 GM/100ML-% IV SOLN
2.0000 g | INTRAVENOUS | Status: AC
  Administered 2021-07-09: 2 g via INTRAVENOUS

## 2021-07-09 MED ORDER — MEPERIDINE HCL 25 MG/ML IJ SOLN: 6.2500 mg | INTRAMUSCULAR | Status: DC | PRN

## 2021-07-09 MED ORDER — LIDOCAINE 2% (20 MG/ML) 5 ML SYRINGE
INTRAMUSCULAR | Status: DC | PRN
  Administered 2021-07-09: 80 mg via INTRAVENOUS

## 2021-07-09 MED ORDER — BUPIVACAINE-EPINEPHRINE (PF) 0.25% -1:200000 IJ SOLN
INTRAMUSCULAR | Status: AC
  Filled 2021-07-09: qty 30

## 2021-07-09 MED ORDER — METRONIDAZOLE 500 MG/100ML IV SOLN
INTRAVENOUS | Status: AC
  Filled 2021-07-09: qty 100

## 2021-07-09 MED ORDER — KETOROLAC TROMETHAMINE 30 MG/ML IJ SOLN: 30.0000 mg | Freq: Once | INTRAMUSCULAR | Status: DC | PRN

## 2021-07-09 MED ORDER — FENTANYL CITRATE (PF) 250 MCG/5ML IJ SOLN
INTRAMUSCULAR | Status: AC
  Filled 2021-07-09: qty 5

## 2021-07-09 MED ORDER — ACETAMINOPHEN 500 MG PO TABS: 1000.0000 mg | ORAL_TABLET | ORAL | Status: AC

## 2021-07-09 MED ORDER — CHLORHEXIDINE GLUCONATE 0.12 % MT SOLN
15.0000 mL | Freq: Once | OROMUCOSAL | Status: AC
  Administered 2021-07-09: 15 mL via OROMUCOSAL
  Filled 2021-07-09: qty 15

## 2021-07-09 MED ORDER — DEXAMETHASONE SODIUM PHOSPHATE 10 MG/ML IJ SOLN
INTRAMUSCULAR | Status: DC | PRN
  Administered 2021-07-09: 10 mg via INTRAVENOUS

## 2021-07-09 MED ORDER — ROCURONIUM BROMIDE 10 MG/ML (PF) SYRINGE
PREFILLED_SYRINGE | INTRAVENOUS | Status: AC
  Filled 2021-07-09: qty 10

## 2021-07-09 MED ORDER — ACETAMINOPHEN 500 MG PO TABS
ORAL_TABLET | ORAL | Status: AC
  Administered 2021-07-09: 1000 mg via ORAL
  Filled 2021-07-09: qty 2

## 2021-07-09 MED ORDER — OXYCODONE HCL 5 MG/5ML PO SOLN
5.0000 mg | Freq: Once | ORAL | Status: DC | PRN
Start: 2021-07-09 — End: 2021-07-09

## 2021-07-09 MED ORDER — METRONIDAZOLE 500 MG/100ML IV SOLN
500.0000 mg | INTRAVENOUS | Status: AC
  Administered 2021-07-09: 500 mg via INTRAVENOUS

## 2021-07-09 MED ORDER — LIDOCAINE 2% (20 MG/ML) 5 ML SYRINGE
INTRAMUSCULAR | Status: AC
  Filled 2021-07-09: qty 5

## 2021-07-09 MED ORDER — GABAPENTIN 300 MG PO CAPS: 300.0000 mg | ORAL_CAPSULE | ORAL | Status: AC

## 2021-07-09 MED ORDER — PROMETHAZINE HCL 25 MG/ML IJ SOLN: 6.2500 mg | INTRAMUSCULAR | Status: DC | PRN

## 2021-07-09 MED ORDER — OXYCODONE HCL 5 MG PO TABS: 5.0000 mg | ORAL_TABLET | Freq: Once | ORAL | Status: DC | PRN

## 2021-07-09 MED ORDER — ORAL CARE MOUTH RINSE: 15.0000 mL | Freq: Once | OROMUCOSAL | Status: AC

## 2021-07-09 MED ORDER — CEFAZOLIN SODIUM-DEXTROSE 2-4 GM/100ML-% IV SOLN
INTRAVENOUS | Status: AC
  Filled 2021-07-09: qty 100

## 2021-07-09 MED ORDER — 0.9 % SODIUM CHLORIDE (POUR BTL) OPTIME
TOPICAL | Status: DC | PRN
  Administered 2021-07-09: 1000 mL

## 2021-07-09 MED ORDER — MIDAZOLAM HCL 2 MG/2ML IJ SOLN
INTRAMUSCULAR | Status: AC
  Filled 2021-07-09: qty 2

## 2021-07-09 MED ORDER — PROPOFOL 10 MG/ML IV BOLUS
INTRAVENOUS | Status: DC | PRN
  Administered 2021-07-09: 200 mg via INTRAVENOUS

## 2021-07-09 MED ORDER — HYDROMORPHONE HCL 1 MG/ML IJ SOLN: 0.2500 mg | INTRAMUSCULAR | Status: DC | PRN

## 2021-07-09 MED ORDER — GABAPENTIN 300 MG PO CAPS
ORAL_CAPSULE | ORAL | Status: AC
  Administered 2021-07-09: 300 mg via ORAL
  Filled 2021-07-09: qty 1

## 2021-07-09 MED ORDER — BUPIVACAINE-EPINEPHRINE 0.25% -1:200000 IJ SOLN
INTRAMUSCULAR | Status: DC | PRN
  Administered 2021-07-09: 18 mL

## 2021-07-09 MED ORDER — ROCURONIUM BROMIDE 100 MG/10ML IV SOLN
INTRAVENOUS | Status: DC | PRN
  Administered 2021-07-09: 70 mg via INTRAVENOUS

## 2021-07-09 MED ORDER — PROPOFOL 10 MG/ML IV BOLUS
INTRAVENOUS | Status: AC
  Filled 2021-07-09: qty 40

## 2021-07-09 MED ORDER — MIDAZOLAM HCL 2 MG/2ML IJ SOLN
INTRAMUSCULAR | Status: DC | PRN
  Administered 2021-07-09: 2 mg via INTRAVENOUS

## 2021-07-09 MED ORDER — LACTATED RINGERS IV SOLN: INTRAVENOUS | Status: DC

## 2021-07-09 MED ORDER — SUGAMMADEX SODIUM 200 MG/2ML IV SOLN
INTRAVENOUS | Status: DC | PRN
  Administered 2021-07-09: 160 mg via INTRAVENOUS

## 2021-07-09 SURGICAL SUPPLY — 41 items
APPLIER CLIP 5 13 M/L LIGAMAX5 (MISCELLANEOUS) ×2
BAG COUNTER SPONGE SURGICOUNT (BAG) ×2 IMPLANT
BLADE CLIPPER SURG (BLADE) IMPLANT
CANISTER SUCT 3000ML PPV (MISCELLANEOUS) ×2 IMPLANT
CHLORAPREP W/TINT 26 (MISCELLANEOUS) ×2 IMPLANT
CLIP APPLIE 5 13 M/L LIGAMAX5 (MISCELLANEOUS) ×1 IMPLANT
COVER SURGICAL LIGHT HANDLE (MISCELLANEOUS) ×2 IMPLANT
CUTTER FLEX LINEAR 45M (STAPLE) ×2 IMPLANT
DERMABOND ADVANCED (GAUZE/BANDAGES/DRESSINGS) ×1
DERMABOND ADVANCED .7 DNX12 (GAUZE/BANDAGES/DRESSINGS) ×1 IMPLANT
ELECT REM PT RETURN 9FT ADLT (ELECTROSURGICAL) ×2
ELECTRODE REM PT RTRN 9FT ADLT (ELECTROSURGICAL) ×1 IMPLANT
ENDOLOOP SUT PDS II  0 18 (SUTURE)
ENDOLOOP SUT PDS II 0 18 (SUTURE) IMPLANT
GLOVE SURG POLY MICRO LF SZ5.5 (GLOVE) ×2 IMPLANT
GLOVE SURG UNDER POLY LF SZ6 (GLOVE) ×2 IMPLANT
GOWN STRL REUS W/ TWL LRG LVL3 (GOWN DISPOSABLE) ×3 IMPLANT
GOWN STRL REUS W/TWL LRG LVL3 (GOWN DISPOSABLE) ×3
KIT BASIN OR (CUSTOM PROCEDURE TRAY) ×2 IMPLANT
KIT TURNOVER KIT B (KITS) ×2 IMPLANT
NS IRRIG 1000ML POUR BTL (IV SOLUTION) ×2 IMPLANT
PAD ARMBOARD 7.5X6 YLW CONV (MISCELLANEOUS) ×4 IMPLANT
PENCIL BUTTON HOLSTER BLD 10FT (ELECTRODE) ×2 IMPLANT
POUCH SPECIMEN RETRIEVAL 10MM (ENDOMECHANICALS) ×2 IMPLANT
RELOAD STAPLE TA45 3.5 REG BLU (ENDOMECHANICALS) ×2 IMPLANT
SCISSORS LAP 5X35 DISP (ENDOMECHANICALS) ×2 IMPLANT
SET IRRIG TUBING LAPAROSCOPIC (IRRIGATION / IRRIGATOR) ×2 IMPLANT
SET TUBE SMOKE EVAC HIGH FLOW (TUBING) ×2 IMPLANT
SHEARS HARMONIC ACE PLUS 36CM (ENDOMECHANICALS) ×2 IMPLANT
SLEEVE ENDOPATH XCEL 5M (ENDOMECHANICALS) ×2 IMPLANT
SPECIMEN JAR SMALL (MISCELLANEOUS) ×2 IMPLANT
SUT MNCRL AB 4-0 PS2 18 (SUTURE) ×2 IMPLANT
SUT VIC AB 3-0 SH 27 (SUTURE) ×1
SUT VIC AB 3-0 SH 27X BRD (SUTURE) ×1 IMPLANT
TOWEL GREEN STERILE (TOWEL DISPOSABLE) ×2 IMPLANT
TOWEL GREEN STERILE FF (TOWEL DISPOSABLE) ×2 IMPLANT
TRAY FOLEY W/BAG SLVR 14FR (SET/KITS/TRAYS/PACK) IMPLANT
TRAY LAPAROSCOPIC MC (CUSTOM PROCEDURE TRAY) ×2 IMPLANT
TROCAR XCEL BLUNT TIP 100MML (ENDOMECHANICALS) ×2 IMPLANT
TROCAR XCEL NON-BLD 5MMX100MML (ENDOMECHANICALS) ×2 IMPLANT
WATER STERILE IRR 1000ML POUR (IV SOLUTION) ×2 IMPLANT

## 2021-07-09 NOTE — Op Note (Signed)
Date: 07/09/21  Patient: Daniel Pruitt MRN: [id number]  Preoperative Diagnosis: Appendicolith Postoperative Diagnosis: Same  Procedure: Laparoscopic appendectomy  Surgeon: Sophronia Simas, MD  EBL: Minimal  Anesthesia: General  Specimens: Appendix  Indications: [name] is a 25 yo male who presented with intermittent RLQ abdominal pain for the last few years. CT scans showed a persistent appendicolith but no findings of acute appendicitis. There have been no other findings on imaging or other workup to explain his symptoms. After a discussion of the risks and benefits of surgery vs continued observation, the patient agreed to proceed with elective appendectomy.  Findings: Grossly normal appearance of the appendix, no findings of acute appendicitis.  Procedure details: Informed consent was obtained in the preoperative area prior to the procedure. The patient was brought to the operating room and placed on the table in the supine position. General anesthesia was induced and appropriate lines and drains were placed for intraoperative monitoring. Perioperative antibiotics were administered per SCIP guidelines. The abdomen was prepped and draped in the usual sterile fashion. A pre-procedure timeout was taken verifying patient identity, surgical site and procedure to be performed.  A small infraumbilical skin incision was made and the subcutaneous tissue was spread to expose the fascia. There was a small umbilical hernia present. The umbilical stalk was circumferentially dissected out and the overlying skin was taken off the stalk and hernia sac sharply. The hernia defect was extended slightly to accommodate a 27mm Hassan trocar. The trocar was placed through the defect and the abdomen was insufflated. The abdomen was inspected with no evidence of visceral or vascular injury. A suprapubic 27mm port was placed, followed by a 57mm port in the LLQ, both under direct visualization. The appendix was  identified in the RLQ, and was grossly normal in appearance. The retroperitoneal attachments were taken down with Harmonic shears. The terminal ileum was somewhat adherent to the pelvic sidewall near the appendix, and this was taken down using careful sharp dissection. The mesoappendix was divided using the harmonic. The appendix was then divided at the base from the cecum using a 66mm stapler with a blue load. The specimen was placed in an endocatch bag and removed and sent for routine pathology. The staple line was inspected and appeared in tact with no bleeding or leakage. The ports were removed and the pneumoperitoneum was evacuated. The umbilical port site fascia was closed with an 0 Vicryl suture. The umbilical skin was tacked down to the fascia using 3-0 Vicryl. The skin at all port sites was closed with 4-0 monocryl subcuticular suture. Dermabond was applied.  The patient tolerated the procedure well with no apparent complications. All counts were correct x2 at the end of the procedure. The patient was extubated and taken to PACU in stable condition.  Sophronia Simas, MD 07/09/21 10:46 AM

## 2021-07-09 NOTE — Discharge Instructions (Addendum)
[facility] DISCHARGE INSTRUCTIONS  Activity No heavy lifting greater than 15 pounds for 4 weeks after surgery. Ok to shower in 24 hours after surgery, but do not bathe or submerge incisions underwater. Do not drive while taking narcotic pain medication.  Wound Care Your incision is covered with skin glue called Dermabond. This will peel off on its own over time. You may shower and allow warm soapy water to run over your incisions. Gently pat dry. Do not submerge your incision underwater. Monitor your incision for any new redness, tenderness, or drainage.  When to Call us: Fever greater than 100.5 New redness, drainage, or swelling at incision site Severe pain, nausea, or vomiting   Follow-up You will have a postoperative appointment with Dr. Freida Busman 3-4 weeks after surgery. This will be at the [facility] office at [address], [place], [place]. Please arrive at least 15 minutes prior to your scheduled appointment time.  For questions or concerns, please call the office at [phone].

## 2021-07-09 NOTE — Transfer of Care (Signed)
Immediate Anesthesia Transfer of Care Note  Patient: Daniel Pruitt  Procedure(s) Performed: LAPAROSCOPIC APPENDECTOMY  Patient Location: PACU  Anesthesia Type:General  Level of Consciousness: awake and patient cooperative  Airway & Oxygen Therapy: Patient Spontanous Breathing  Post-op Assessment: Report given to RN and Post -op Vital signs reviewed and stable  Post vital signs: Reviewed and stable  Last Vitals:  Vitals Value Taken Time  BP 111/62 07/09/21 1050  Temp 36.4 C 07/09/21 1048  Pulse 50 07/09/21 1053  Resp 20 07/09/21 1055  SpO2 96 % 07/09/21 1053  Vitals shown include unvalidated device data.  Last Pain:  Vitals:   07/09/21 1048  TempSrc:   PainSc: Asleep      Patients Stated Pain Goal: 4 (07/09/21 0820)  Complications: No notable events documented.

## 2021-07-09 NOTE — Anesthesia Postprocedure Evaluation (Signed)
Anesthesia Post Note  Patient: Daniel Pruitt  Procedure(s) Performed: LAPAROSCOPIC APPENDECTOMY     Patient location during evaluation: PACU Anesthesia Type: General Level of consciousness: awake and alert, oriented and patient cooperative Pain management: pain level controlled Vital Signs Assessment: post-procedure vital signs reviewed and stable Respiratory status: spontaneous breathing, nonlabored ventilation and respiratory function stable Cardiovascular status: blood pressure returned to baseline and stable Postop Assessment: no apparent nausea or vomiting Anesthetic complications: no   No notable events documented.  Last Vitals:  Vitals:   07/09/21 1115 07/09/21 1120  BP: 119/66 122/80  Pulse: (!) 53 (!) 53  Resp: 19 13  Temp:    SpO2: 100% 100%    Last Pain:  Vitals:   07/09/21 1048  TempSrc:   PainSc: Asleep                 Lannie Fields

## 2021-07-09 NOTE — Anesthesia Procedure Notes (Signed)
Procedure Name: Intubation Date/Time: 07/09/2021 9:46 AM Performed by: Stanton Kidney, CRNA Pre-anesthesia Checklist: Patient identified, Emergency Drugs available, Suction available and Patient being monitored Patient Re-evaluated:Patient Re-evaluated prior to induction Oxygen Delivery Method: Circle system utilized Preoxygenation: Pre-oxygenation with 100% oxygen Induction Type: IV induction Ventilation: Mask ventilation without difficulty Laryngoscope Size: Miller and 3 Tube type: Oral Tube size: 7.5 mm Number of attempts: 1 Airway Equipment and Method: Stylet and Oral airway Placement Confirmation: ETT inserted through vocal cords under direct vision, positive ETCO2 and breath sounds checked- equal and bilateral Tube secured with: Tape Dental Injury: Teeth and Oropharynx as per pre-operative assessment

## 2021-07-09 NOTE — Interval H&P Note (Signed)
History and Physical Interval Note:  07/09/2021 8:57 AM  Daniel Pruitt  has presented today for surgery, with the diagnosis of APPENDICOLITH.  The various methods of treatment have been discussed with the patient and family. After consideration of risks, benefits and other options for treatment, the patient has consented to  Procedure(s): LAPAROSCOPIC APPENDECTOMY (N/A) as a surgical intervention.  The patient's history has been reviewed, patient examined, no change in status, stable for surgery.  I have reviewed the patient's chart and labs.  Questions were answered to the patient's satisfaction.     Fritzi Mandes

## 2021-07-10 ENCOUNTER — Encounter (HOSPITAL_COMMUNITY): Payer: Self-pay | Admitting: Surgery

## 2021-07-10 LAB — SURGICAL PATHOLOGY

## 2022-04-16 ENCOUNTER — Ambulatory Visit
Admission: RE | Admit: 2022-04-16 | Discharge: 2022-04-16 | Disposition: A | Discharge status: Home / Self Care | Payer: 59 | Source: Ambulatory Visit | Attending: Family Medicine | Admitting: Family Medicine

## 2022-04-16 ENCOUNTER — Other Ambulatory Visit: Payer: Self-pay | Admitting: Family Medicine

## 2022-04-16 DIAGNOSIS — M25532 Pain in left wrist: Secondary | ICD-10-CM

## 2023-04-10 IMAGING — CT CT ABD-PELV W/O CM
2 of 4 series · 16 of 46 positions shown, 18 images · non-contrast
Comparison: CT 10/19/2018

CLINICAL DATA: Right lower quadrant pain intermittently for 3-4
years, increasing over the last week, known appendicolith.

EXAM:
CT ABDOMEN AND PELVIS WITHOUT CONTRAST
TECHNIQUE: Multidetector CT imaging of the abdomen and pelvis was performed
following the standard protocol without IV contrast.

[Series 2: abd pel wo · axial · 0.72mm/px · z∈[+817,+1237]mm · 13 of 92 slices shown, 15 images]
[im 4/92  soft-tissue]
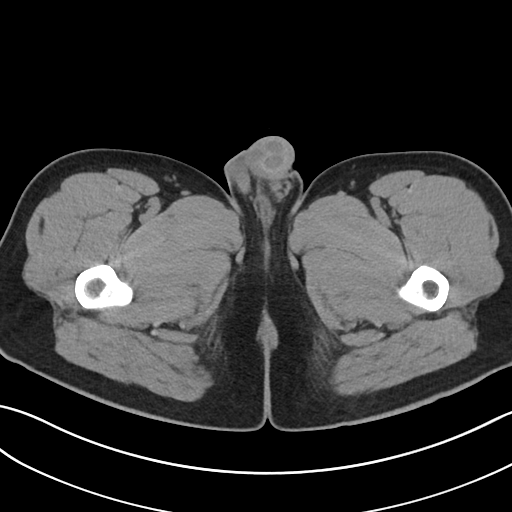
[im 4/92  bone]
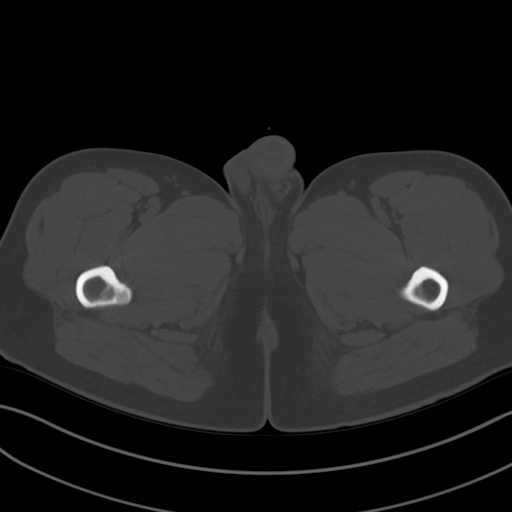
[im 11/92  soft-tissue]
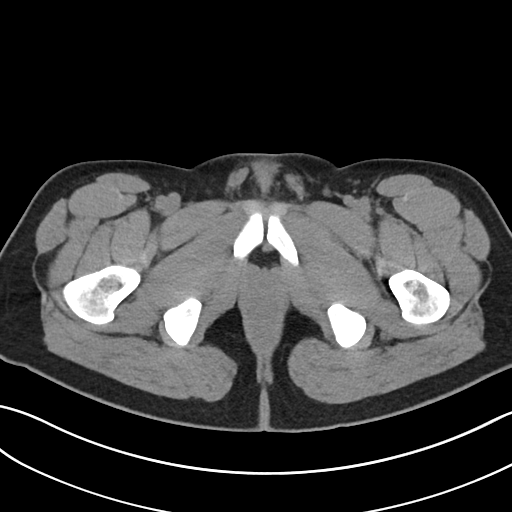
[im 18/92  soft-tissue]
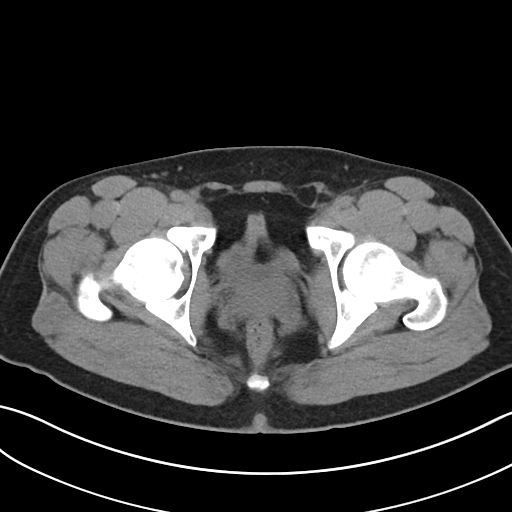
[im 25/92  soft-tissue]
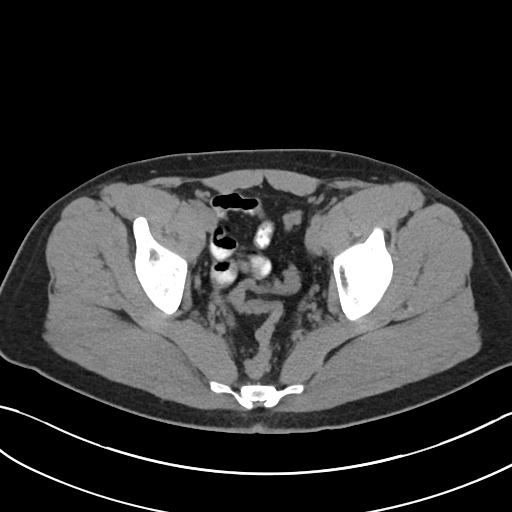
[im 32/92  soft-tissue]
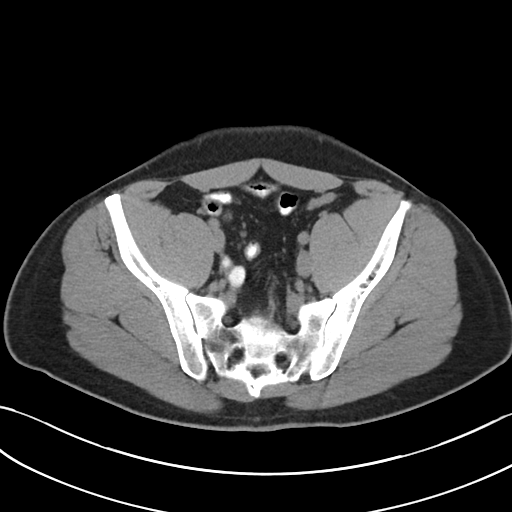
[im 39/92  soft-tissue]
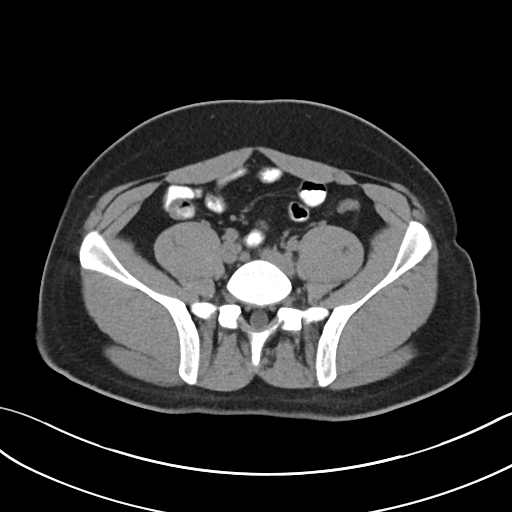
[im 46/92  soft-tissue]
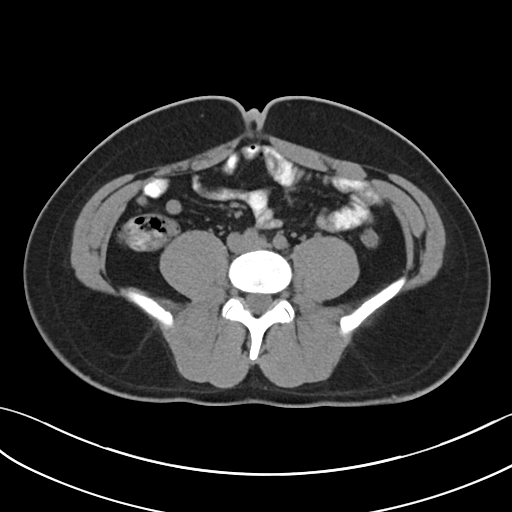
[im 53/92  soft-tissue]
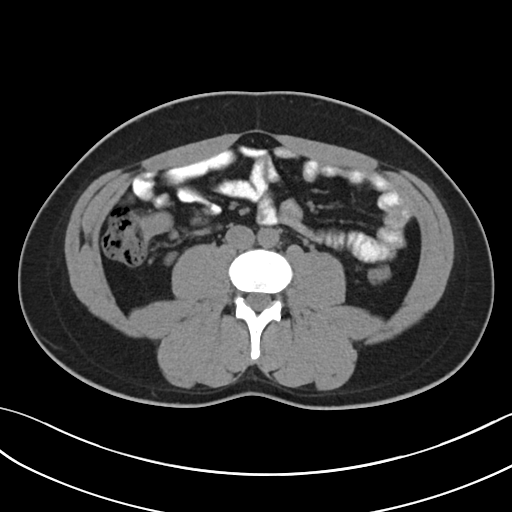
[im 60/92  soft-tissue]
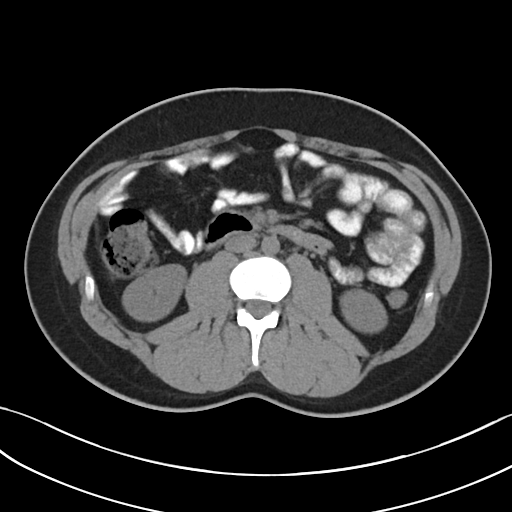
[im 60/92  bone]
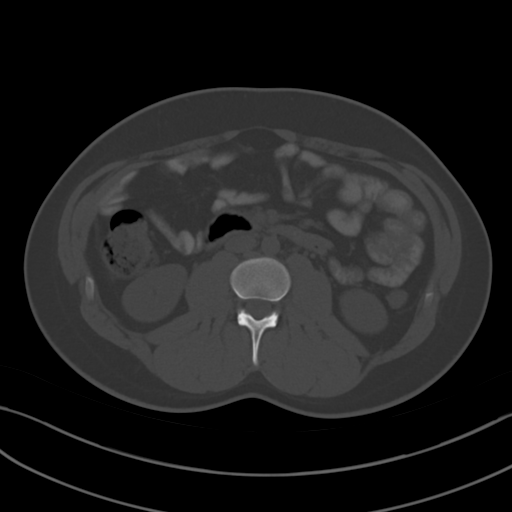
[im 67/92  soft-tissue]
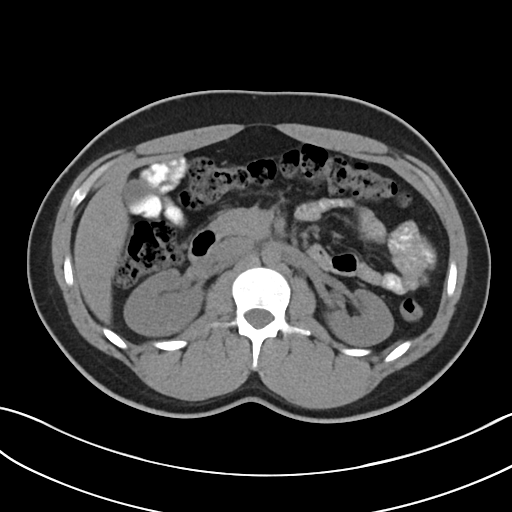
[im 74/92  soft-tissue]
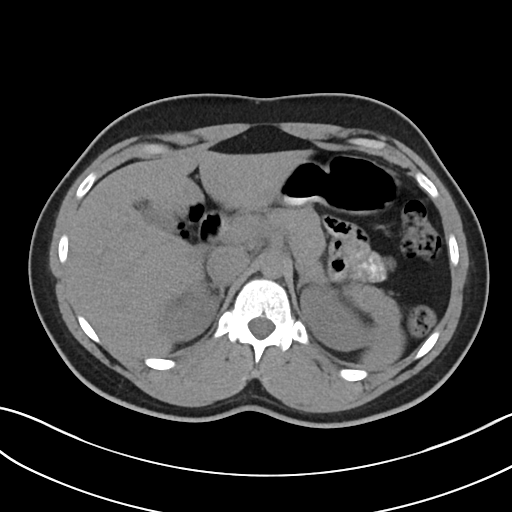
[im 81/92  soft-tissue]
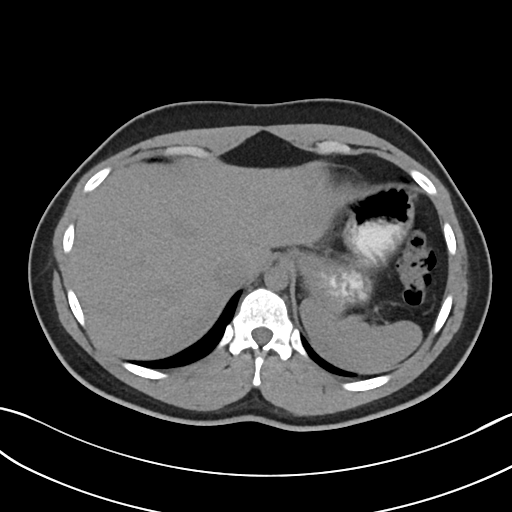
[im 88/92  soft-tissue]
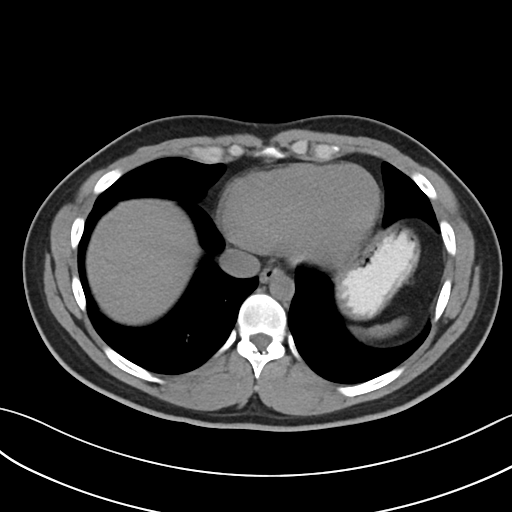

[Series 5: coronal · coronal · 0.80mm/px · 3 of 88 slices shown]
[im 30/88  soft-tissue]
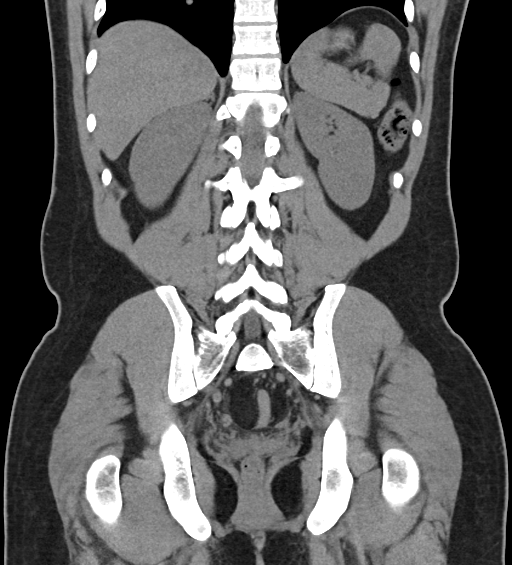
[im 39/88  soft-tissue]
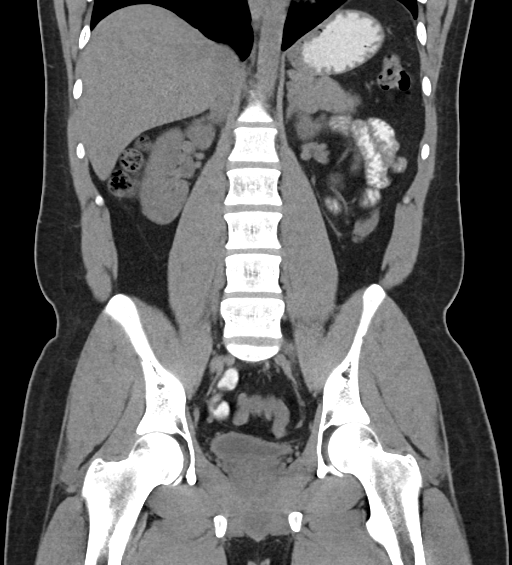
[im 49/88  soft-tissue]
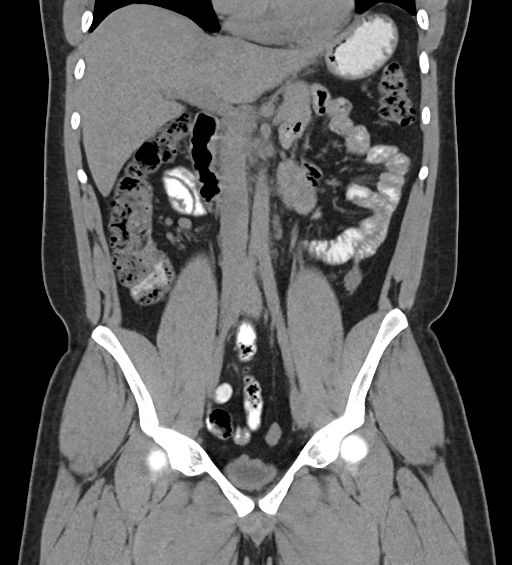

[16 of 46 positions shown; findings below may reference images not displayed]

FINDINGS: Lower chest: Lung bases are clear. Normal heart size. No pericardial
effusion.

Hepatobiliary: No visible focal liver lesion with limitations of an
unenhanced CT. Smooth surface contour. Normal hepatic attenuation.
Normal gallbladder and biliary tree without visible calcified
gallstone or ductal dilatation.

Pancreas: No pancreatic ductal dilatation or surrounding
inflammatory changes.

Spleen: Normal in size. No concerning splenic lesions.

Adrenals/Urinary Tract: Normal adrenal glands. Kidneys are symmetric
in size normally located. No visible or contour deforming renal
lesion. No urolithiasis or hydronephrosis. Urinary bladder is
largely decompressed at the time of exam and therefore poorly
evaluated by CT imaging. Mild wall thickening may be related to
underdistention.

Stomach/Bowel: Distal esophagus, stomach and duodenal sweep are
unremarkable. No small bowel wall thickening or dilatation. No
evidence of obstruction. Elongated, partially appendix seen in the
right lower quadrant without focal periappendiceal inflammation.
Small appendicolith is again noted within the appendix itself. No
colonic dilatation or wall thickening.

Vascular/Lymphatic: Few clustered prominent right lower quadrant
nodes are again seen, quite similar in appearance to comparison
exam. No pathologically enlarged nodes are seen at this location or
elsewhere in the abdomen or pelvis. No significant vascular
findings.

Reproductive: The prostate and seminal vesicles are unremarkable.

Other: No abdominopelvic free fluid or free gas. No bowel containing
hernias.

Musculoskeletal: No acute osseous abnormality or suspicious osseous
lesion.
IMPRESSION: Small appendicolith again seen within an otherwise normal appearing
appendix. No acute inflammatory changes to suggest appendicitis at
this time.

Clustered prominent right lower quadrant lymph nodes are nonspecific
and while these could be reactive versus a mesenteric adenitis
though stability from prior exams could also suggest near an
incidental finding.

Mild bladder wall thickening, favored be secondary to
underdistention. Correlate for urinary symptoms with urinalysis as
warranted.
